# Patient Record
Sex: Male | Born: 2000 | Race: Black or African American | Hispanic: No | Marital: Single | Smoking: Never smoker
Health system: Southern US, Community
[De-identification: ages and names within clinical notes are randomized; demographics above are authoritative.]

## PROBLEM LIST (undated history)

## (undated) DIAGNOSIS — K429 Umbilical hernia without obstruction or gangrene: Secondary | ICD-10-CM

## (undated) HISTORY — PX: TYMPANOSTOMY TUBE PLACEMENT: SHX32

## (undated) HISTORY — PX: ADENOIDECTOMY: SUR15

---
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
## 2021-04-13 ENCOUNTER — Ambulatory Visit: Payer: Self-pay | Admitting: Nurse Practitioner
